# Patient Record
Sex: Male | Born: 1990 | Race: Black or African American | Hispanic: No | Marital: Single | Smoking: Never smoker
Health system: Southern US, Community
[De-identification: ages and names within clinical notes are randomized; demographics above are authoritative.]

## PROBLEM LIST (undated history)

## (undated) ENCOUNTER — Emergency Department (HOSPITAL_COMMUNITY): Admission: EM | Payer: Self-pay | Source: Home / Self Care

## (undated) HISTORY — PX: RECTAL SURGERY: SHX760

---

## 2019-07-23 ENCOUNTER — Encounter (HOSPITAL_COMMUNITY): Payer: Self-pay | Admitting: Family Medicine

## 2019-07-23 ENCOUNTER — Other Ambulatory Visit: Payer: Self-pay

## 2019-07-23 ENCOUNTER — Ambulatory Visit (HOSPITAL_COMMUNITY)
Admission: EM | Admit: 2019-07-23 | Discharge: 2019-07-23 | Disposition: A | Discharge status: Home / Self Care | Payer: Self-pay | Attending: Family Medicine | Admitting: Family Medicine

## 2019-07-23 DIAGNOSIS — Z20822 Contact with and (suspected) exposure to covid-19: Secondary | ICD-10-CM

## 2019-07-23 DIAGNOSIS — J069 Acute upper respiratory infection, unspecified: Secondary | ICD-10-CM

## 2019-07-23 DIAGNOSIS — L209 Atopic dermatitis, unspecified: Secondary | ICD-10-CM

## 2019-07-23 DIAGNOSIS — Z20828 Contact with and (suspected) exposure to other viral communicable diseases: Secondary | ICD-10-CM

## 2019-07-23 MED ORDER — NAPROXEN 500 MG PO TABS: 500.0000 mg | ORAL_TABLET | Freq: Two times a day (BID) | ORAL | 0 refills | Status: DC

## 2019-07-23 MED ORDER — TRIAMCINOLONE ACETONIDE 0.1 % EX CREA: 1.0000 "application " | TOPICAL_CREAM | Freq: Two times a day (BID) | CUTANEOUS | 0 refills | Status: AC

## 2019-07-23 MED ORDER — BENZONATATE 100 MG PO CAPS: ORAL_CAPSULE | ORAL | 0 refills | Status: DC

## 2019-07-23 NOTE — ED Triage Notes (Signed)
Pt states he needs a Covid test for work. Pt states he needs a work note. Pt states he has had some eye pain and redness.x 2 weeks. Pt has been around someone on his job with Covid.

## 2019-07-23 NOTE — Discharge Instructions (Addendum)
If your Covid-19 test is positive, you will get a phone call from [facility] regarding your results. If your Covid-19 test is negative, you will NOT get a phone call from [place] with your results. You may view your results on MyChart. If you do not have a MyChart account, sign up instructions are in your discharge papers. ° °

## 2019-07-25 LAB — NOVEL CORONAVIRUS, NAA (HOSP ORDER, SEND-OUT TO REF LAB; TAT 18-24 HRS): SARS-CoV-2, NAA: NOT DETECTED

## 2019-07-26 NOTE — ED Provider Notes (Signed)
[facility]   [id number] 07/23/19 Arrival Time: 1528  ASSESSMENT & PLAN:  1. Exposure to COVID-19 virus   2. Atopic dermatitis, unspecified type   3. Viral URI with cough     COVID testing sent. To self-quarantine until results are available. Work note provided.  Meds ordered this encounter  Medications  . benzonatate (TESSALON) 100 MG capsule    Sig: Take 1 capsule by mouth every 8 (eight) hours for cough.    Dispense:  21 capsule    Refill:  0  . naproxen (NAPROSYN) 500 MG tablet    Sig: Take 1 tablet (500 mg total) by mouth 2 (two) times daily with a meal.    Dispense:  20 tablet    Refill:  0  . triamcinolone cream (KENALOG) 0.1 %    Sig: Apply 1 application topically 2 (two) times daily.    Dispense:  45 g    Refill:  0    OTC symptom care as needed. Ensure adequate fluid intake and rest. May f/u with PCP or here as needed.  Reviewed expectations re: course of current medical issues. Questions answered. Outlined signs and symptoms indicating need for more acute intervention. Patient verbalized understanding. After Visit Summary given.   SUBJECTIVE: History from: patient.  Martin Rivera is a 28 y.o. male who presents with complaint of mild nasal congestion and eye "irritation" for the past several days; questions longer. Dry coughing for a few days ;without fatigue and without body aches. SOB: none. Wheezing: none. Fever: absent. Overall normal PO intake without n/v. Known sick contacts or COVID-19 exposure: co-worker. No specific or significant aggravating or alleviating factors reported. OTC treatment: none reported.  Received flu shot this year: no.  Social History   Tobacco Use  Smoking Status Never Smoker  Smokeless Tobacco Never Used   Also with h/o eczema on hands. Requests a steroid cream. Worse in winter. No drainage or pain.  ROS: As per HPI. All other systems negative.    OBJECTIVE:  Vitals:   07/23/19 1617 07/23/19 1621  BP:   108/67  Pulse:  80  Resp:  18  Temp:  98.8 F (37.1 C)  SpO2:  100%  Weight: 79.4 kg      General appearance: alert; appears fatigued HEENT: nasal congestion; clear runny nose; throat irritation secondary to post-nasal drainage Neck: supple without LAD CV: RRR Lungs: unlabored respirations, symmetrical air entry without wheezing; cough: mild and dry Abd: soft Ext: no LE edema Skin: warm and dry; eczema of hands; no signs of infection Neuro: normal gait Psychological: alert and cooperative; normal mood and affect    No Known Allergies  PMH:  "Healthy".  Family History  Problem Relation Age of Onset  . Hypertension Mother   . Healthy Father    Social History   Socioeconomic History  . Marital status: Single    Spouse name: Not on file  . Number of children: Not on file  . Years of education: Not on file  . Highest education level: Not on file  Occupational History  . Not on file  Social Needs  . Financial resource strain: Not on file  . Food insecurity    Worry: Not on file    Inability: Not on file  . Transportation needs    Medical: Not on file    Non-medical: Not on file  Tobacco Use  . Smoking status: Never Smoker  . Smokeless tobacco: Never Used  Substance and Sexual Activity  .  Alcohol use: Yes  . Drug use: Never  . Sexual activity: Yes  Lifestyle  . Physical activity    Days per week: Not on file    Minutes per session: Not on file  . Stress: Not on file  Relationships  . Social Herbalist on phone: Not on file    Gets together: Not on file    Attends religious service: Not on file    Active member of club or organization: Not on file    Attends meetings of clubs or organizations: Not on file    Relationship status: Not on file  . Intimate partner violence    Fear of current or ex partner: Not on file    Emotionally abused: Not on file    Physically abused: Not on file    Forced sexual activity: Not on file  Other Topics Concern   . Not on file  Social History Narrative  . Not on file           Vanessa Kick, MD 07/26/19 [phone]

## 2019-08-06 ENCOUNTER — Other Ambulatory Visit: Payer: Self-pay

## 2019-08-06 ENCOUNTER — Ambulatory Visit (HOSPITAL_COMMUNITY)
Admission: EM | Admit: 2019-08-06 | Discharge: 2019-08-06 | Disposition: A | Discharge status: Home / Self Care | Payer: Medicaid Other | Attending: Family Medicine | Admitting: Family Medicine

## 2019-08-06 ENCOUNTER — Encounter (HOSPITAL_COMMUNITY): Payer: Self-pay

## 2019-08-06 DIAGNOSIS — J40 Bronchitis, not specified as acute or chronic: Secondary | ICD-10-CM

## 2019-08-06 MED ORDER — DOXYCYCLINE CALCIUM 50 MG/5ML PO SYRP: 100.0000 mg | ORAL_SOLUTION | Freq: Two times a day (BID) | ORAL | 0 refills | Status: AC

## 2019-08-06 MED ORDER — CETIRIZINE HCL 1 MG/ML PO SOLN: 10.0000 mg | Freq: Every day | ORAL | 0 refills | Status: DC

## 2019-08-06 MED ORDER — PSEUDOEPH-BROMPHEN-DM 30-2-10 MG/5ML PO SYRP: 5.0000 mL | ORAL_SOLUTION | Freq: Four times a day (QID) | ORAL | 0 refills | Status: DC | PRN

## 2019-08-06 NOTE — ED Provider Notes (Signed)
[place]    CSN: [id number] Arrival date & time: 08/06/19  1342      History   Chief Complaint Chief Complaint  Patient presents with  . Cough    HPI Martin Rivera is a 28 y.o. male no significant past medical history presenting today for evaluation of cough and congestion.  Patient states that he has had cough and congestion for the past 2 weeks.  He was seen here on 11/4 shortly after symptoms started and was tested for Covid which returned negative.  He has been using over-the-counter medicine without for relief.  He has had mild improvement in symptoms but symptoms have still been rather persistent.  Notices symptoms worse at nighttime.  Denies any fevers.  Eating and drinking normally.  Has felt some fatigue.  Previous smoker, denies current tobacco use.  Denies history of asthma.  HPI  History reviewed. No pertinent past medical history.  There are no active problems to display for this patient.   Past Surgical History:  Procedure Laterality Date  . RECTAL SURGERY         Home Medications    Prior to Admission medications   Medication Sig Start Date End Date Taking? Authorizing Provider  brompheniramine-pseudoephedrine-DM 30-2-10 MG/5ML syrup Take 5 mLs by mouth 4 (four) times daily as needed. 08/06/19   ,  C, PA-C  cetirizine HCl (ZYRTEC) 1 MG/ML solution Take 10 mLs (10 mg total) by mouth daily for 10 days. 08/06/19 08/16/19  ,  C, PA-C  doxycycline (VIBRAMYCIN) 50 MG/5ML SYRP Take 10 mLs (100 mg total) by mouth 2 (two) times daily for 7 days. 08/06/19 08/13/19  ,  C, PA-C  naproxen (NAPROSYN) 500 MG tablet Take 1 tablet (500 mg total) by mouth 2 (two) times daily with a meal. 07/23/19   Vanessa Kick, MD  triamcinolone cream (KENALOG) 0.1 % Apply 1 application topically 2 (two) times daily. 07/23/19   Vanessa Kick, MD    Family History Family History  Problem Relation Age of Onset  . Hypertension Mother   .  Healthy Father     Social History Social History   Tobacco Use  . Smoking status: Never Smoker  . Smokeless tobacco: Never Used  Substance Use Topics  . Alcohol use: Yes  . Drug use: Never     Allergies   Patient has no known allergies.   Review of Systems Review of Systems  Constitutional: Positive for fatigue. Negative for activity change, appetite change, chills and fever.  HENT: Positive for congestion and rhinorrhea. Negative for ear pain, sinus pressure, sore throat and trouble swallowing.   Eyes: Negative for discharge and redness.  Respiratory: Positive for cough. Negative for chest tightness and shortness of breath.   Cardiovascular: Negative for chest pain.  Gastrointestinal: Negative for abdominal pain, diarrhea, nausea and vomiting.  Musculoskeletal: Negative for myalgias.  Skin: Negative for rash.  Neurological: Negative for dizziness, light-headedness and headaches.     Physical Exam Triage Vital Signs ED Triage Vitals  Enc Vitals Group     BP 08/06/19 1401 110/70     Pulse Rate 08/06/19 1401 82     Resp 08/06/19 1401 16     Temp 08/06/19 1401 97.8 F (36.6 C)     Temp src --      SpO2 08/06/19 1401 100 %     Weight 08/06/19 1402 175 lb (79.4 kg)     Height --      Head Circumference --  Peak Flow --      Pain Score 08/06/19 1402 0     Pain Loc --      Pain Edu? --      Excl. in GC? --    No data found.  Updated Vital Signs BP 110/70 (BP Location: Right Arm)   Pulse 82   Temp 97.8 F (36.6 C)   Resp 16   Wt 175 lb (79.4 kg)   SpO2 100%   Visual Acuity Right Eye Distance:   Left Eye Distance:   Bilateral Distance:    Right Eye Near:   Left Eye Near:    Bilateral Near:     Physical Exam Vitals signs and nursing note reviewed.  Constitutional:      Appearance: He is well-developed.  HENT:     Head: Normocephalic and atraumatic.     Ears:     Comments: Bilateral ears without tenderness to palpation of external auricle,  tragus and mastoid, EAC's without erythema or swelling, TM's with good bony landmarks and cone of light. Non erythematous.    Nose:     Comments: Nasal mucosa mildly erythematous, nonswollen turbinates    Mouth/Throat:     Comments: Oral mucosa pink and moist, no tonsillar enlargement or exudate. Posterior pharynx patent and nonerythematous, no uvula deviation or swelling. Normal phonation. Eyes:     Conjunctiva/sclera: Conjunctivae normal.  Neck:     Musculoskeletal: Neck supple.  Cardiovascular:     Rate and Rhythm: Normal rate and regular rhythm.     Heart sounds: No murmur.  Pulmonary:     Effort: Pulmonary effort is normal. No respiratory distress.     Breath sounds: Normal breath sounds.     Comments: Breathing comfortably at rest, CTABL, no wheezing, rales or other adventitious sounds auscultated Abdominal:     Palpations: Abdomen is soft.     Tenderness: There is no abdominal tenderness.  Skin:    General: Skin is warm and dry.  Neurological:     Mental Status: He is alert.      UC Treatments / Results  Labs (all labs ordered are listed, but only abnormal results are displayed) Labs Reviewed - No data to display  EKG   Radiology No results found.  Procedures Procedures (including critical care time)  Medications Ordered in UC Medications - No data to display  Initial Impression / Assessment and Plan / UC Course  I have reviewed the triage vital signs and the nursing notes.  Pertinent labs & imaging results that were available during my care of the patient were reviewed by me and considered in my medical decision making (see chart for details).     Previous Covid test negative, will not repeat today given same symptoms.  Given length of symptoms will go out and cover for atypicals as well as sinusitis with doxycycline.  Providing cetirizine and cough syrup to use as well.  Push fluids.  Work note provided, may return to work.  Discussed strict return  precautions. Patient verbalized understanding and is agreeable with plan.  Final Clinical Impressions(s) / UC Diagnoses   Final diagnoses:  Bronchitis     Discharge Instructions     Please begin taking doxycycline twice daily for the next week Take 10 mL of cetirizine/Zyrtec daily to further help with congestion, drainage and allergy symptoms Cough syrup as needed every 8 hours for cough or congestion Please continue to drink plenty of fluids  Please follow-up if symptoms persisting or worsening, developing  shortness of breath or difficulty breathing, fevers   ED Prescriptions    Medication Sig Dispense Auth. Provider   doxycycline (VIBRAMYCIN) 50 MG/5ML SYRP Take 10 mLs (100 mg total) by mouth 2 (two) times daily for 7 days. 200 mL ,  C, PA-C   brompheniramine-pseudoephedrine-DM 30-2-10 MG/5ML syrup Take 5 mLs by mouth 4 (four) times daily as needed. 120 mL ,  C, PA-C   cetirizine HCl (ZYRTEC) 1 MG/ML solution Take 10 mLs (10 mg total) by mouth daily for 10 days. 118 mL , [place] C, PA-C     PDMP not reviewed this encounter.   Lew Dawes,  C, PA-C 08/06/19 1431

## 2019-08-06 NOTE — ED Triage Notes (Signed)
Pt states he has a cold , cough and congestion. Pr would like to have a liquid for medicine. Pt states she took a Covid test it was negative.

## 2019-08-06 NOTE — Discharge Instructions (Signed)
Please begin taking doxycycline twice daily for the next week Take 10 mL of cetirizine/Zyrtec daily to further help with congestion, drainage and allergy symptoms Cough syrup as needed every 8 hours for cough or congestion Please continue to drink plenty of fluids  Please follow-up if symptoms persisting or worsening, developing shortness of breath or difficulty breathing, fevers

## 2019-11-24 ENCOUNTER — Other Ambulatory Visit: Payer: Self-pay

## 2019-11-24 ENCOUNTER — Encounter (HOSPITAL_COMMUNITY): Payer: Self-pay | Admitting: Emergency Medicine

## 2019-11-24 ENCOUNTER — Ambulatory Visit (HOSPITAL_COMMUNITY)
Admission: EM | Admit: 2019-11-24 | Discharge: 2019-11-24 | Disposition: A | Discharge status: Home / Self Care | Payer: HRSA Program | Attending: Urgent Care | Admitting: Urgent Care

## 2019-11-24 DIAGNOSIS — Z20822 Contact with and (suspected) exposure to covid-19: Secondary | ICD-10-CM

## 2019-11-24 DIAGNOSIS — R0789 Other chest pain: Secondary | ICD-10-CM | POA: Diagnosis present

## 2019-11-24 DIAGNOSIS — R0981 Nasal congestion: Secondary | ICD-10-CM | POA: Diagnosis present

## 2019-11-24 DIAGNOSIS — B349 Viral infection, unspecified: Secondary | ICD-10-CM

## 2019-11-24 DIAGNOSIS — R05 Cough: Secondary | ICD-10-CM | POA: Insufficient documentation

## 2019-11-24 DIAGNOSIS — R0602 Shortness of breath: Secondary | ICD-10-CM

## 2019-11-24 DIAGNOSIS — R430 Anosmia: Secondary | ICD-10-CM

## 2019-11-24 DIAGNOSIS — U071 COVID-19: Secondary | ICD-10-CM | POA: Insufficient documentation

## 2019-11-24 DIAGNOSIS — R059 Cough, unspecified: Secondary | ICD-10-CM

## 2019-11-24 MED ORDER — PSEUDOEPHEDRINE HCL 60 MG PO TABS: 60.0000 mg | ORAL_TABLET | Freq: Three times a day (TID) | ORAL | 0 refills | Status: AC | PRN

## 2019-11-24 MED ORDER — PROMETHAZINE-DM 6.25-15 MG/5ML PO SYRP: 5.0000 mL | ORAL_SOLUTION | Freq: Every evening | ORAL | 0 refills | Status: AC | PRN

## 2019-11-24 MED ORDER — CETIRIZINE HCL 10 MG PO TABS: 10.0000 mg | ORAL_TABLET | Freq: Every day | ORAL | 0 refills | Status: AC

## 2019-11-24 NOTE — Discharge Instructions (Signed)

## 2019-11-24 NOTE — ED Provider Notes (Signed)
[place]   MRN: [id number] DOB: 11-25-1990  Subjective:   Martin Rivera is a 29 y.o. male presenting for 4 day hx of acute onset worsening moderate-severe malaise including ROS below. Has used otc medications with minimal relief. Denies smoking, lung disorder. Has had general COVID exposure from the public.  Denies taking chronic medications.    No Known Allergies  History reviewed. No pertinent past medical history.   Past Surgical History:  Procedure Laterality Date  . RECTAL SURGERY      Family History  Problem Relation Age of Onset  . Hypertension Mother   . Healthy Father     Social History   Tobacco Use  . Smoking status: Never Smoker  . Smokeless tobacco: Never Used  Substance Use Topics  . Alcohol use: Yes  . Drug use: Never    Review of Systems  Constitutional: Positive for chills, fever and malaise/fatigue.  HENT: Positive for congestion. Negative for ear pain, sinus pain and sore throat.   Eyes: Negative for discharge and redness.  Respiratory: Positive for cough and shortness of breath. Negative for hemoptysis and wheezing.   Cardiovascular: Positive for chest pain.  Gastrointestinal: Negative for abdominal pain, diarrhea, nausea and vomiting.  Genitourinary: Negative for dysuria, flank pain and hematuria.  Musculoskeletal: Positive for myalgias.  Skin: Negative for rash.  Neurological: Negative for dizziness, weakness and headaches.  Psychiatric/Behavioral: Negative for depression and substance abuse.     Objective:   Vitals: BP 135/87 (BP Location: Right Arm)   Pulse 95   Temp 98.6 F (37 C) (Oral)   Resp 18   SpO2 100%   Physical Exam Constitutional:      General: He is not in acute distress.    Appearance: Normal appearance. He is well-developed. He is not ill-appearing, toxic-appearing or diaphoretic.  HENT:     Head: Normocephalic and atraumatic.     Right Ear: External ear normal.     Left Ear: External ear normal.   Nose: Nose normal.     Mouth/Throat:     Mouth: Mucous membranes are moist.     Pharynx: Oropharynx is clear. No oropharyngeal exudate or posterior oropharyngeal erythema.  Eyes:     General: No scleral icterus.       Right eye: No discharge.        Left eye: No discharge.     Extraocular Movements: Extraocular movements intact.     Pupils: Pupils are equal, round, and reactive to light.  Cardiovascular:     Rate and Rhythm: Normal rate and regular rhythm.     Heart sounds: Normal heart sounds. No murmur. No friction rub. No gallop.   Pulmonary:     Effort: Pulmonary effort is normal. No respiratory distress.     Breath sounds: Normal breath sounds. No stridor. No wheezing, rhonchi or rales.  Skin:    General: Skin is warm and dry.  Neurological:     Mental Status: He is alert and oriented to person, place, and time.  Psychiatric:        Mood and Affect: Mood normal.        Behavior: Behavior normal.        Thought Content: Thought content normal.        Judgment: Judgment normal.     Assessment and Plan :   1. Viral illness   2. Nasal congestion   3. Loss of sense of smell   4. Exposure to COVID-19 virus   5. Atypical  chest pain   6. Cough   7. Shortness of breath     Will manage for viral illness such as viral URI, viral rhinitis, possible COVID-19. Counseled patient on nature of COVID-19 including modes of transmission, diagnostic testing, management and supportive care.  Offered symptomatic relief. COVID 19 testing is pending. Will defer imaging due to reassuring PE findings, stable vital signs. Counseled patient on potential for adverse effects with medications prescribed/recommended today, strict ER and return-to-clinic precautions discussed, patient verbalized understanding.     Wallis Bamberg, PA-C 11/24/19 1622

## 2019-11-24 NOTE — ED Triage Notes (Signed)
Pt sts nasal congestion and loss of smell x 3 days with subjective fever yesterday

## 2019-11-26 LAB — NOVEL CORONAVIRUS, NAA (HOSP ORDER, SEND-OUT TO REF LAB; TAT 18-24 HRS): SARS-CoV-2, NAA: DETECTED — AB

## 2019-11-27 ENCOUNTER — Telehealth (HOSPITAL_COMMUNITY): Payer: Self-pay | Admitting: Emergency Medicine

## 2019-11-27 NOTE — Telephone Encounter (Signed)

## 2020-03-31 ENCOUNTER — Emergency Department (HOSPITAL_COMMUNITY)
Admission: EM | Admit: 2020-03-31 | Discharge: 2020-04-01 | Disposition: A | Discharge status: Home / Self Care | Payer: Self-pay | Attending: Emergency Medicine | Admitting: Emergency Medicine

## 2020-03-31 ENCOUNTER — Encounter (HOSPITAL_COMMUNITY): Payer: Self-pay

## 2020-03-31 ENCOUNTER — Emergency Department (HOSPITAL_COMMUNITY): Payer: Self-pay

## 2020-03-31 DIAGNOSIS — Z5321 Procedure and treatment not carried out due to patient leaving prior to being seen by health care provider: Secondary | ICD-10-CM | POA: Insufficient documentation

## 2020-03-31 DIAGNOSIS — M79672 Pain in left foot: Secondary | ICD-10-CM | POA: Insufficient documentation

## 2020-03-31 DIAGNOSIS — M25572 Pain in left ankle and joints of left foot: Secondary | ICD-10-CM | POA: Insufficient documentation

## 2020-03-31 MED ORDER — OXYCODONE-ACETAMINOPHEN 5-325 MG PO TABS
1.0000 | ORAL_TABLET | ORAL | Status: DC | PRN
  Filled 2020-03-31: qty 1

## 2020-03-31 NOTE — ED Triage Notes (Signed)
Pt arrives POV for eval of L ankle pain after rolling it this evening while moving furniture. Pt reports severe L ankle pain and foot pain since that time.

## 2020-04-01 NOTE — ED Notes (Signed)
Pt stated he is going somewhere else

## 2021-08-03 IMAGING — CR DG ANKLE COMPLETE 3+V*L*
4 series · 4 of 4 positions shown · non-contrast
Comparison: None.

CLINICAL DATA: Lateral left ankle pain after fall down stairs
today.

EXAM:
LEFT ANKLE COMPLETE - 3+ VIEW

[ankle obl]
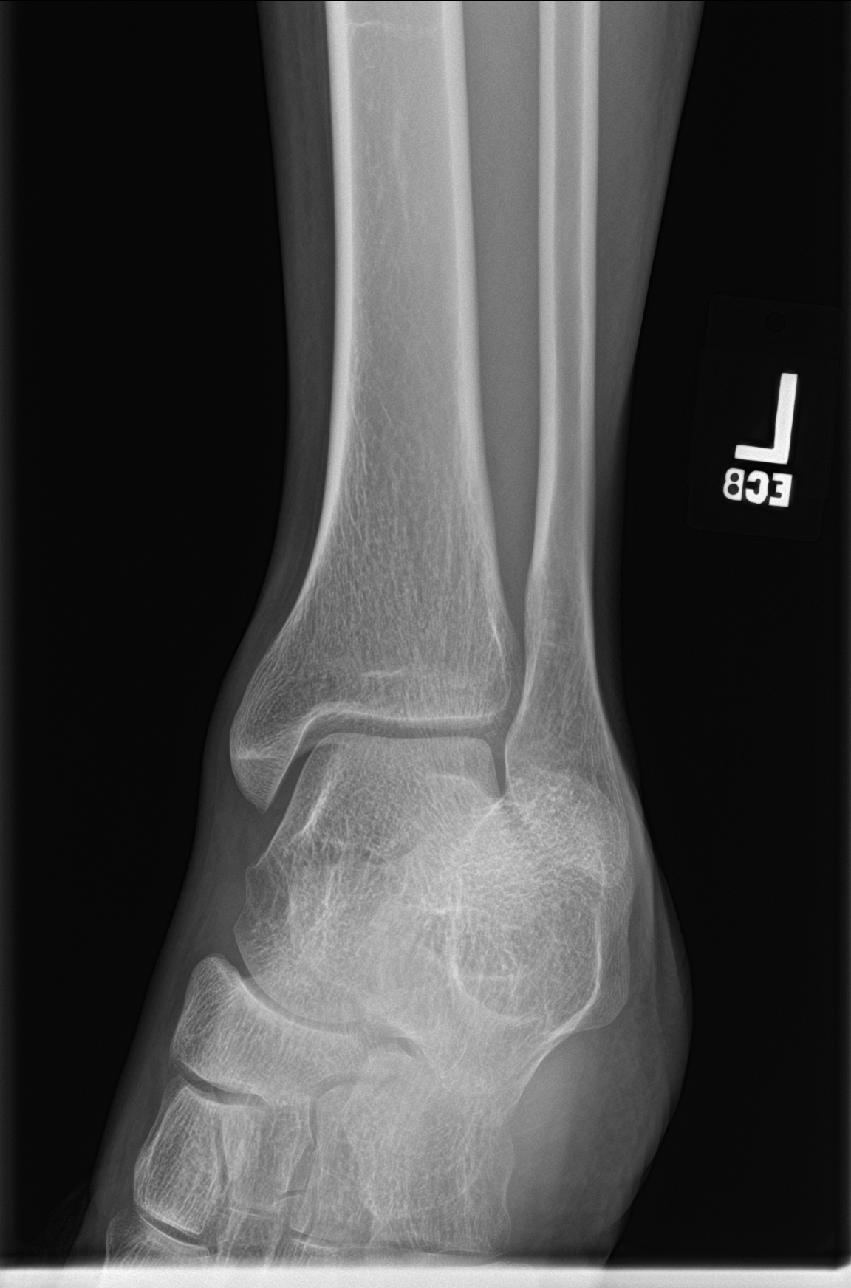

[ankle ap]
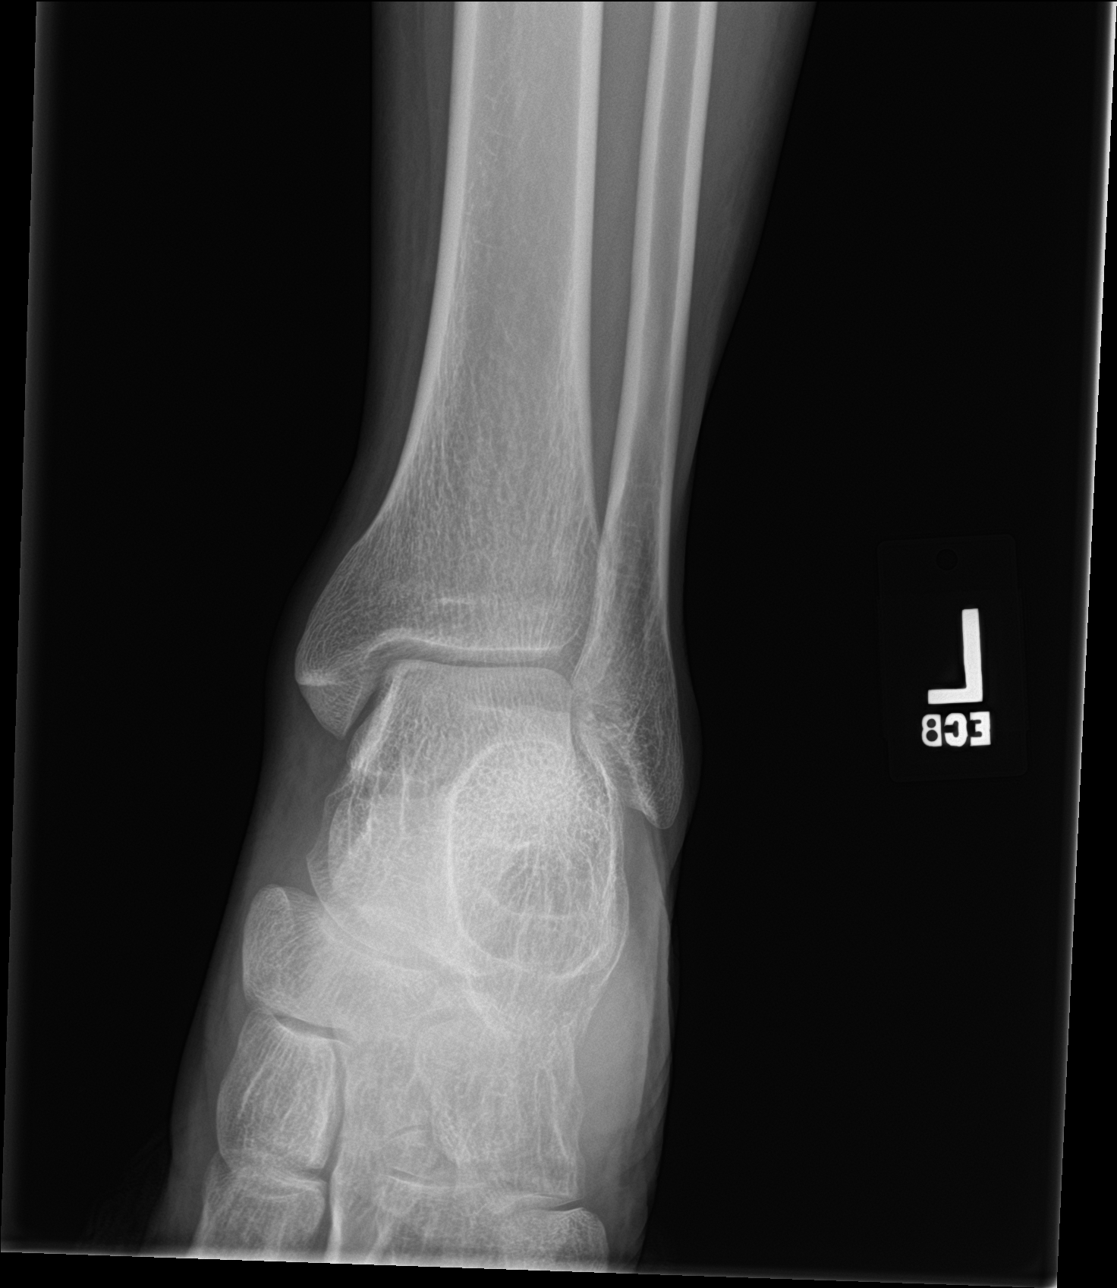

[ankle lat (1 of 2)]
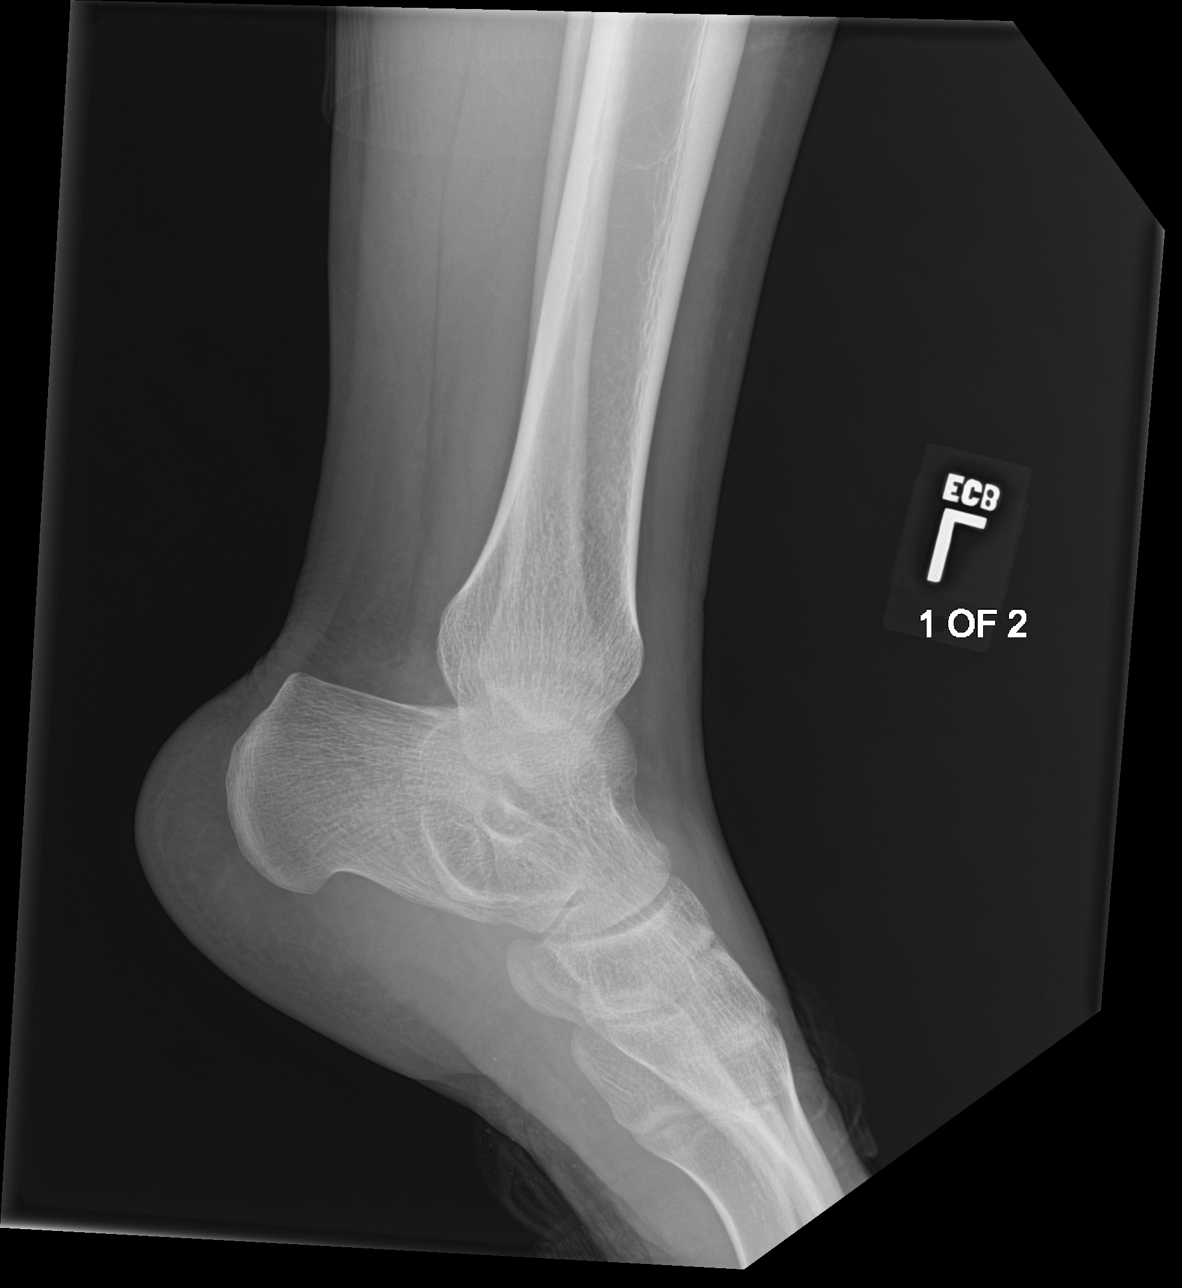

[ankle lat (2 of 2)]
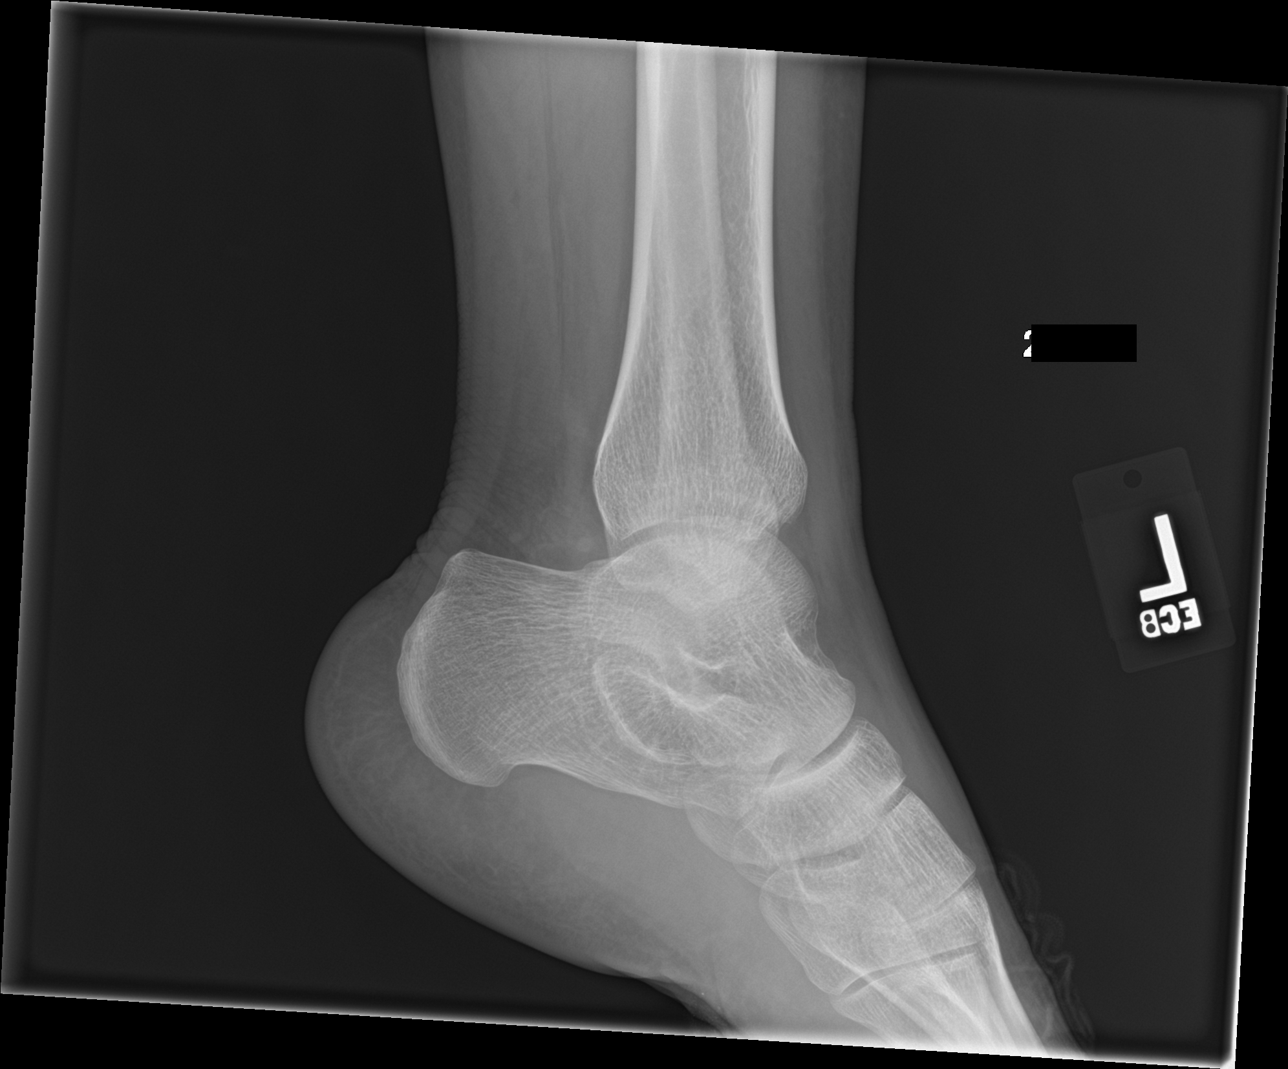

[4 of 4 positions shown; findings below may reference images not displayed]

FINDINGS: There is no evidence of fracture, dislocation, or joint effusion.
There is no evidence of arthropathy or other focal bone abnormality.
Soft tissues are unremarkable.
IMPRESSION: Negative radiographs of the left ankle.
